# Patient Record
Sex: Male | Born: 1985 | Race: White | Hispanic: No | Marital: Single | Smoking: Former smoker
Health system: Southern US, Community
[De-identification: ages and names within clinical notes are randomized; demographics above are authoritative.]

---

## 2016-11-14 ENCOUNTER — Emergency Department (HOSPITAL_COMMUNITY)
Admission: EM | Admit: 2016-11-14 | Discharge: 2016-11-14 | Disposition: A | Discharge status: Home Health | Payer: BLUE CROSS/BLUE SHIELD | Attending: Emergency Medicine | Admitting: Emergency Medicine

## 2016-11-14 ENCOUNTER — Encounter (HOSPITAL_COMMUNITY): Payer: Self-pay | Admitting: Emergency Medicine

## 2016-11-14 ENCOUNTER — Emergency Department (HOSPITAL_COMMUNITY): Payer: BLUE CROSS/BLUE SHIELD

## 2016-11-14 DIAGNOSIS — R109 Unspecified abdominal pain: Secondary | ICD-10-CM | POA: Diagnosis present

## 2016-11-14 DIAGNOSIS — N200 Calculus of kidney: Secondary | ICD-10-CM | POA: Insufficient documentation

## 2016-11-14 DIAGNOSIS — Z87891 Personal history of nicotine dependence: Secondary | ICD-10-CM | POA: Diagnosis not present

## 2016-11-14 LAB — URINALYSIS, ROUTINE W REFLEX MICROSCOPIC
BILIRUBIN URINE: NEGATIVE
Bacteria, UA: NONE SEEN
GLUCOSE, UA: NEGATIVE mg/dL
Ketones, ur: NEGATIVE mg/dL
Leukocytes, UA: NEGATIVE
Nitrite: NEGATIVE
Protein, ur: NEGATIVE mg/dL
SPECIFIC GRAVITY, URINE: 1.02 (ref 1.005–1.030)
pH: 7 (ref 5.0–8.0)

## 2016-11-14 MED ORDER — HYDROMORPHONE HCL 1 MG/ML IJ SOLN
1.0000 mg | Freq: Once | INTRAMUSCULAR | Status: AC
  Administered 2016-11-14: 1 mg via INTRAVENOUS
  Filled 2016-11-14: qty 1

## 2016-11-14 MED ORDER — OXYCODONE-ACETAMINOPHEN 5-325 MG PO TABS: 1.0000 | ORAL_TABLET | ORAL | 0 refills | Status: AC | PRN

## 2016-11-14 MED ORDER — KETOROLAC TROMETHAMINE 30 MG/ML IJ SOLN
30.0000 mg | Freq: Once | INTRAMUSCULAR | Status: AC
  Administered 2016-11-14: 30 mg via INTRAVENOUS
  Filled 2016-11-14: qty 1

## 2016-11-14 MED ORDER — TAMSULOSIN HCL 0.4 MG PO CAPS: 0.4000 mg | ORAL_CAPSULE | Freq: Every day | ORAL | 0 refills | Status: AC

## 2016-11-14 MED ORDER — ONDANSETRON HCL 4 MG/2ML IJ SOLN
4.0000 mg | Freq: Once | INTRAMUSCULAR | Status: AC
  Administered 2016-11-14: 4 mg via INTRAVENOUS
  Filled 2016-11-14: qty 2

## 2016-11-14 MED ORDER — ONDANSETRON HCL 4 MG PO TABS: 4.0000 mg | ORAL_TABLET | Freq: Four times a day (QID) | ORAL | 0 refills | Status: AC

## 2016-11-14 NOTE — ED Provider Notes (Signed)
[facility]-EM[facility]Y DEPT Provider Note   CSN: [id number] Arrival date & time: 11/14/16  0911     History   Chief Complaint Chief Complaint  Patient presents with  . Flank Pain    HPI Edgar Stevens is a 31 y.o. male.  HPI   Edgar Stevens is a 31 y.o. male who presents to the Emergency Department complaining of Gradually worsening right flank pain. He states that he noticed a mild pain 2 days ago to his right flank that spontaneously resolved. He woke this morning with pain that began approximately one hour ago. He describes the pain is intense and associated with dark urine, nausea, vomiting, and sweating. He describes the pain as sharp and radiating toward his right groin. He denies previous kidney stones, but admits to family history of them. He denies difficulty urinating, fever, recent illness, testicular pain or swelling.  History reviewed. No pertinent past medical history.  There are no active problems to display for this patient.   History reviewed. No pertinent surgical history.     Home Medications    Prior to Admission medications   Not on File    Family History No family history on file.  Social History Social History  Substance Use Topics  . Smoking status: Former Games developer  . Smokeless tobacco: Not on file  . Alcohol use Yes     Comment: occasional     Allergies   Patient has no known allergies.   Review of Systems Review of Systems  Constitutional: Positive for appetite change and diaphoresis.  HENT: Negative for congestion and sore throat.   Eyes: Negative.   Respiratory: Negative for cough and shortness of breath.   Cardiovascular: Negative for chest pain.  Gastrointestinal: Positive for nausea and vomiting. Negative for abdominal pain.  Genitourinary: Positive for decreased urine volume and flank pain (Right flank pain). Negative for dysuria, frequency, scrotal swelling and testicular pain.       Dark-colored urine for 2 days    Musculoskeletal: Negative for back pain and neck pain.  Skin: Negative for rash.  Neurological: Negative for dizziness and headaches.  Hematological: Does not bruise/bleed easily.  Psychiatric/Behavioral: The patient is not nervous/anxious.      Physical Exam Updated Vital Signs BP (!) 134/101   Pulse 64   Temp 98.3 F (36.8 C)   Resp 18   Ht 6' (1.829 m)   Wt 102.1 kg   SpO2 100%   BMI 30.52 kg/m   Physical Exam  Constitutional: He is oriented to person, place, and time. He appears well-nourished.  Patient appears uncomfortable, nontoxic, diaphoretic  HENT:  Head: Normocephalic.  Mouth/Throat: Oropharynx is clear and moist.  Eyes: EOM are normal. Pupils are equal, round, and reactive to light.  Neck: Normal range of motion. Neck supple.  Cardiovascular: Normal rate.   Pulmonary/Chest: Effort normal. No respiratory distress. He has no wheezes. He has no rales.  Abdominal: Soft. He exhibits no distension and no mass. There is no tenderness. There is no guarding.  Pt points to right flank as area of pain.  No CVA tenderness on exam.  Remaining abdomen is soft, NT  Musculoskeletal: Normal range of motion.  Neurological: He is alert and oriented to person, place, and time.  Skin: Skin is warm. No rash noted.  Nursing note and vitals reviewed.    ED Treatments / Results  Labs (all labs ordered are listed, but only abnormal results are displayed) Labs Reviewed  URINALYSIS, ROUTINE W REFLEX MICROSCOPIC - Abnormal;  Notable for the following:       Result Value   Hgb urine dipstick LARGE (*)    All other components within normal limits    EKG  EKG Interpretation None       Radiology Ct Renal Stone Study  Result Date: 11/14/2016 CLINICAL DATA:  Right flank pain since this morning. Urinary frequency. Ex-smoker. EXAM: CT ABDOMEN AND PELVIS WITHOUT CONTRAST TECHNIQUE: Multidetector CT imaging of the abdomen and pelvis was performed following the standard protocol  without IV contrast. COMPARISON:  None. FINDINGS: Lower chest: Mild diffuse peribronchial thickening. Hepatobiliary: No focal liver abnormality is seen. No gallstones, gallbladder wall thickening, or biliary dilatation. Pancreas: Unremarkable. No pancreatic ductal dilatation or surrounding inflammatory changes. Spleen: Normal in size without focal abnormality. Adrenals/Urinary Tract: Mild to moderate dilatation of the right renal collecting system and proximal ureter to the level of a 6 mm calculus in the ureter. This is visible on the scout image. Normal appearing left kidney, left ureter, urinary bladder and distal right ureter. Normal appearing adrenal glands. Stomach/Bowel: Stomach is within normal limits. Appendix appears normal. No evidence of bowel wall thickening, distention, or inflammatory changes. Vascular/Lymphatic: No significant vascular findings are present. No enlarged abdominal or pelvic lymph nodes. Reproductive: Normal appearing prostate gland. Other: None. Musculoskeletal: Normal appearing bones. IMPRESSION: 6 mm proximal right ureteral calculus causing mild to moderate right hydronephrosis. Electronically Signed   By: Beckie SaltsSteven  Reid M.D.   On: 11/14/2016 11:25    Procedures Procedures (including critical care time)  Medications Ordered in ED Medications  ketorolac (TORADOL) 30 MG/ML injection 30 mg (not administered)  HYDROmorphone (DILAUDID) injection 1 mg (1 mg Intravenous Given 11/14/16 0943)  ondansetron (ZOFRAN) injection 4 mg (4 mg Intravenous Given 11/14/16 0943)  HYDROmorphone (DILAUDID) injection 1 mg (1 mg Intravenous Given 11/14/16 1019)     Initial Impression / Assessment and Plan / ED Course  I have reviewed the triage vital signs and the nursing notes.  Pertinent labs & imaging results that were available during my care of the patient were reviewed by me and considered in my medical decision making (see chart for details).  Clinical Course     Pt uncomfortable  appearing, but nontoxic.  Likely kidney stone will control pain and obtain CT renal stone study.     CT stone study reveals 6 mm stone prox ureter.  Pain improved after IV medications.  Appears stable for d/c.  Discussed need to close urology f/u.  rx for percocet, flomax and zofran.   Final Clinical Impressions(s) / ED Diagnoses   Final diagnoses:  Kidney stone on right side    New Prescriptions New Prescriptions   No medications on file     Pauline Ausammy Gaylynn Seiple, PA-C 11/14/16 1226    Benjiman CoreNathan Pickering, MD 11/14/16 1512

## 2016-11-14 NOTE — ED Triage Notes (Signed)
Right flank pain since 0800 this am. N/v/sweating.

## 2016-11-14 NOTE — ED Notes (Signed)
Pt was given 1 mg of dilaudid at 1019.  Pt states that he is still in pain and is requesting more pain medication.  Advised patient that he would not be able to get anymore at this time.  Pt is gone to CT for renal study.

## 2016-11-14 NOTE — Discharge Instructions (Signed)
Strain all urine.  Call the urology doctor listed to arrange a follow-up appt.  Return to ER for any worsening symptoms

## 2018-01-31 IMAGING — CT CT RENAL STONE PROTOCOL
2 of 4 series · 16 of 46 positions shown, 18 images · non-contrast
Comparison: None.

CLINICAL DATA: Right flank pain since this morning. Urinary
frequency. Ex-smoker.

EXAM:
CT ABDOMEN AND PELVIS WITHOUT CONTRAST
TECHNIQUE: Multidetector CT imaging of the abdomen and pelvis was performed
following the standard protocol without IV contrast.

[Series 2: axial st · axial · 0.78mm/px · z∈[+917,+1377]mm · 13 of 102 slices shown, 15 images]
[im 5/102  soft-tissue]
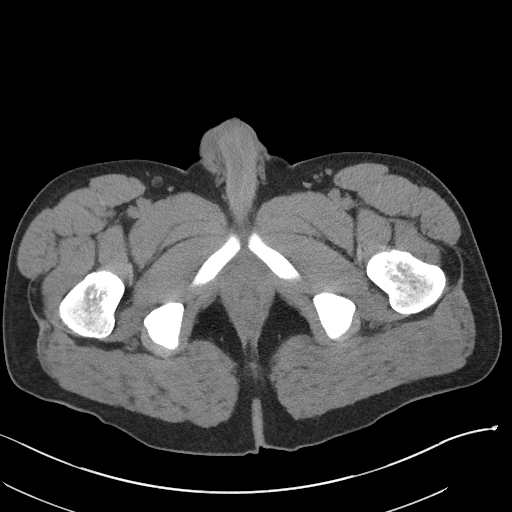
[im 5/102  bone]
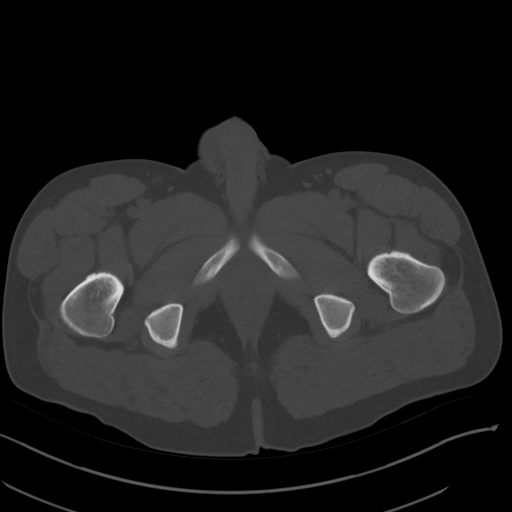
[im 13/102  soft-tissue]
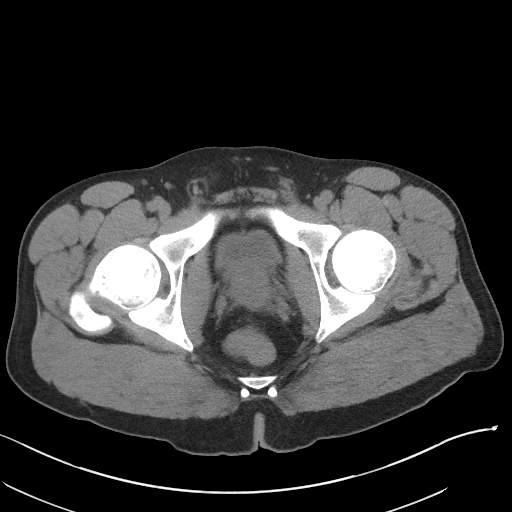
[im 21/102  soft-tissue]
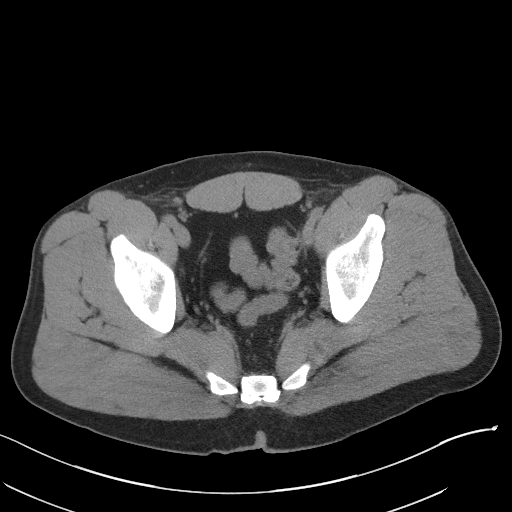
[im 29/102  soft-tissue]
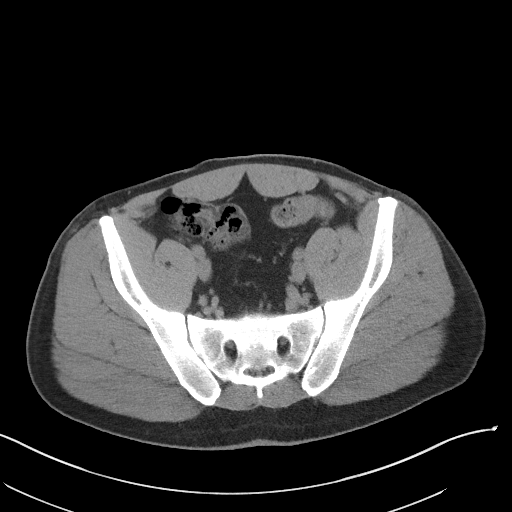
[im 37/102  soft-tissue]
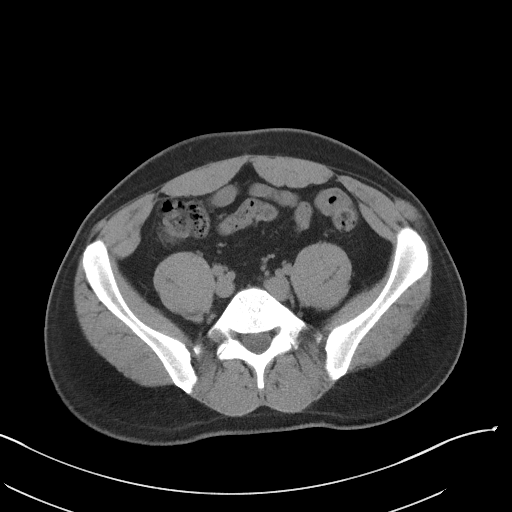
[im 45/102  soft-tissue]
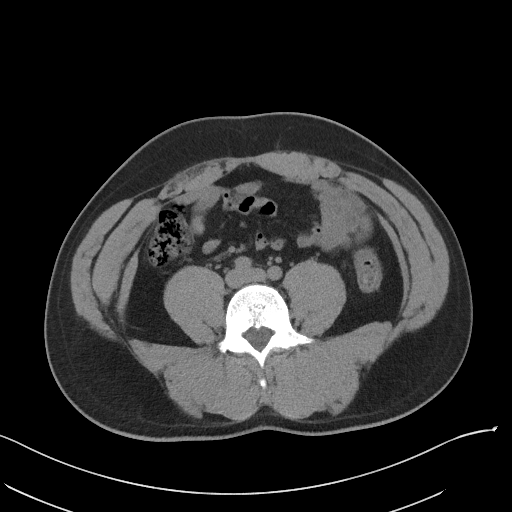
[im 53/102  soft-tissue]
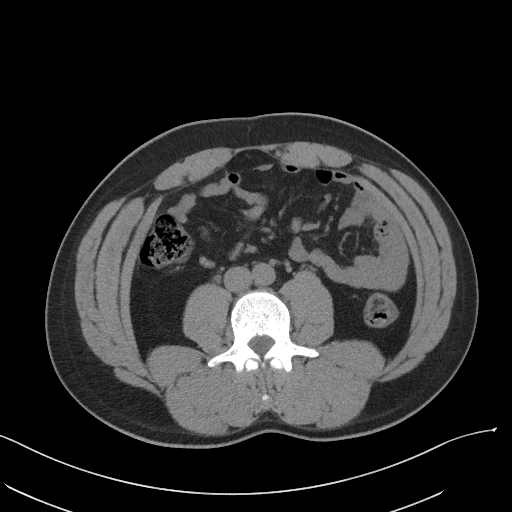
[im 57/102  soft-tissue]
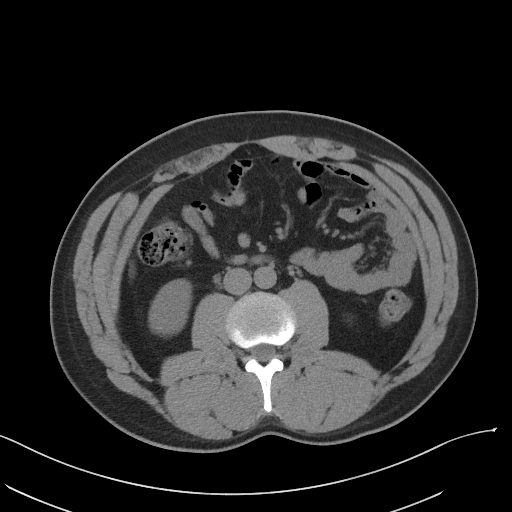
[im 65/102  soft-tissue]
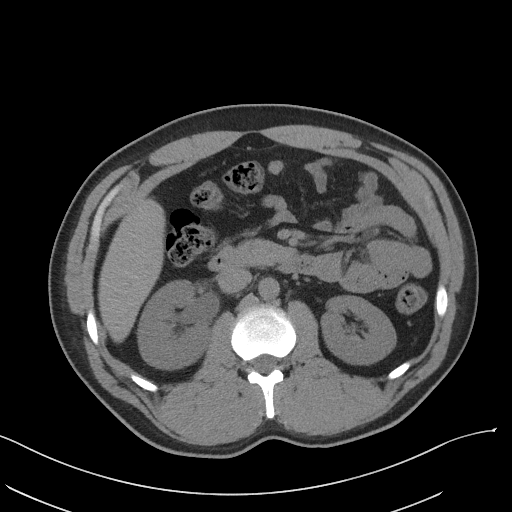
[im 65/102  bone]
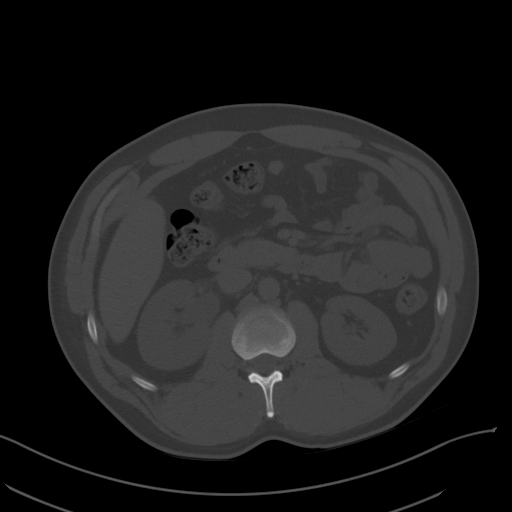
[im 73/102  soft-tissue]
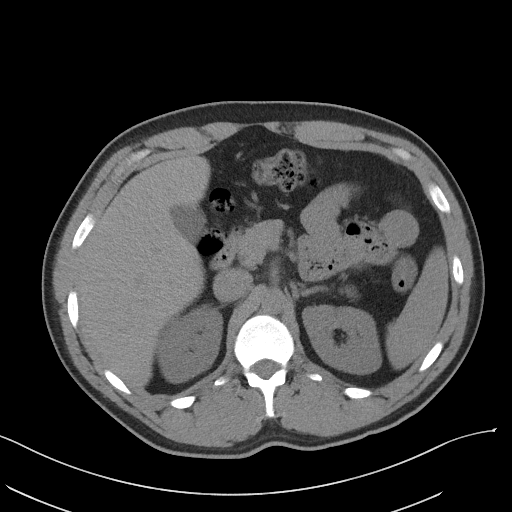
[im 81/102  soft-tissue]
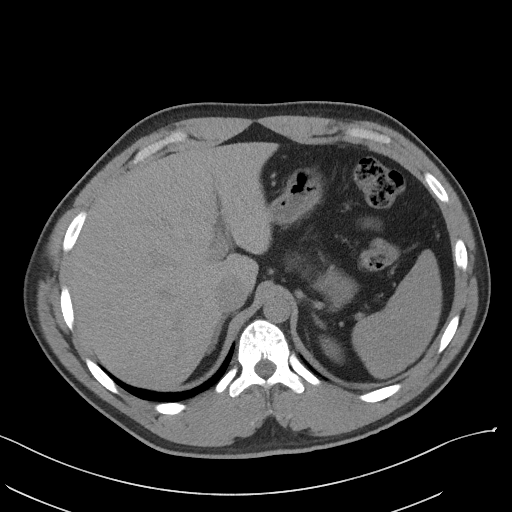
[im 89/102  soft-tissue]
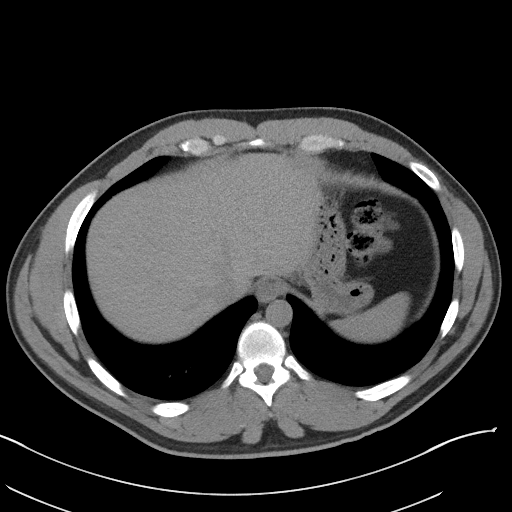
[im 97/102  soft-tissue]
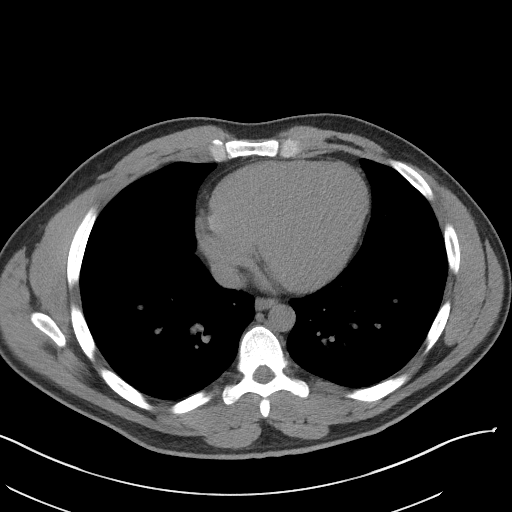

[Series 3: coronal st · coronal · 0.80mm/px · 3 of 101 slices shown]
[im 34/101  soft-tissue]
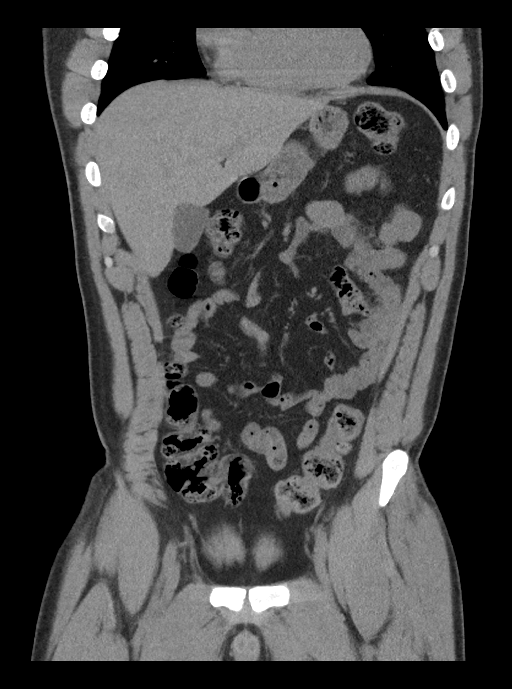
[im 45/101  soft-tissue]
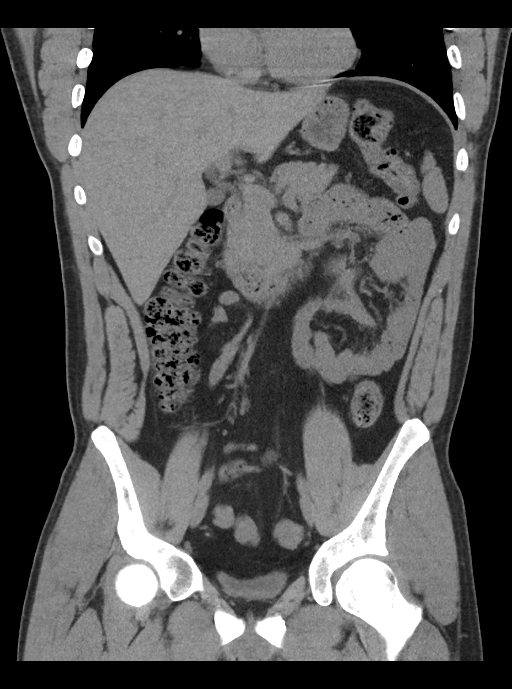
[im 56/101  soft-tissue]
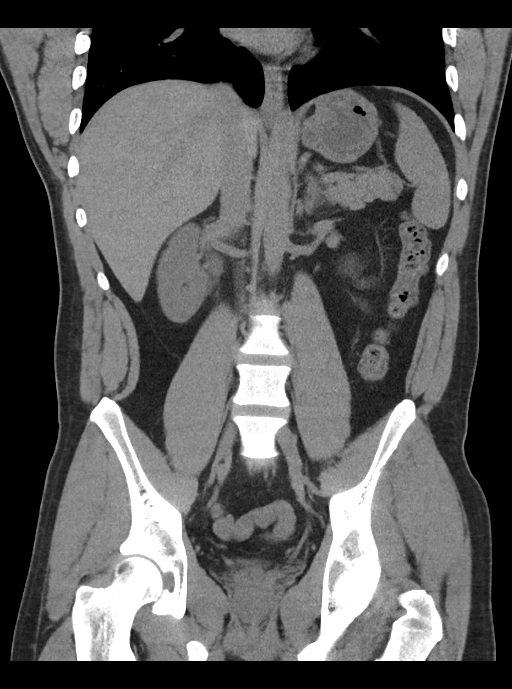

[16 of 46 positions shown; findings below may reference images not displayed]

FINDINGS: Lower chest: Mild diffuse peribronchial thickening.

Hepatobiliary: No focal liver abnormality is seen. No gallstones,
gallbladder wall thickening, or biliary dilatation.

Pancreas: Unremarkable. No pancreatic ductal dilatation or
surrounding inflammatory changes.

Spleen: Normal in size without focal abnormality.

Adrenals/Urinary Tract: Mild to moderate dilatation of the right
renal collecting system and proximal ureter to the level of a 6 mm
calculus in the ureter. This is visible on the scout image. Normal
appearing left kidney, left ureter, urinary bladder and distal right
ureter. Normal appearing adrenal glands.

Stomach/Bowel: Stomach is within normal limits. Appendix appears
normal. No evidence of bowel wall thickening, distention, or
inflammatory changes.

Vascular/Lymphatic: No significant vascular findings are present. No
enlarged abdominal or pelvic lymph nodes.

Reproductive: Normal appearing prostate gland.

Other: None.

Musculoskeletal: Normal appearing bones.
IMPRESSION: 6 mm proximal right ureteral calculus causing mild to moderate right
hydronephrosis.
